# Patient Record
Sex: Male | Born: 1960 | Race: White | Hispanic: No | Marital: Single
Health system: Southern US, Community
[De-identification: ages and names within clinical notes are randomized; demographics above are authoritative.]

## PROBLEM LIST (undated history)

## (undated) HISTORY — PX: NEPHRECTOMY TRANSPLANTED ORGAN: SUR880

---

## 2018-11-24 ENCOUNTER — Emergency Department (HOSPITAL_COMMUNITY): Payer: Medicare HMO

## 2018-11-24 ENCOUNTER — Encounter (HOSPITAL_COMMUNITY): Payer: Self-pay | Admitting: Emergency Medicine

## 2018-11-24 ENCOUNTER — Inpatient Hospital Stay (HOSPITAL_COMMUNITY)
Admission: EM | Admit: 2018-11-24 | Discharge: 2018-12-06 | DRG: 871 | Disposition: E | Payer: Medicare HMO | Attending: Pulmonary Disease | Admitting: Pulmonary Disease

## 2018-11-24 DIAGNOSIS — Z9049 Acquired absence of other specified parts of digestive tract: Secondary | ICD-10-CM | POA: Diagnosis not present

## 2018-11-24 DIAGNOSIS — I314 Cardiac tamponade: Secondary | ICD-10-CM | POA: Insufficient documentation

## 2018-11-24 DIAGNOSIS — Z79891 Long term (current) use of opiate analgesic: Secondary | ICD-10-CM

## 2018-11-24 DIAGNOSIS — Z7982 Long term (current) use of aspirin: Secondary | ICD-10-CM

## 2018-11-24 DIAGNOSIS — M47816 Spondylosis without myelopathy or radiculopathy, lumbar region: Secondary | ICD-10-CM | POA: Diagnosis present

## 2018-11-24 DIAGNOSIS — E669 Obesity, unspecified: Secondary | ICD-10-CM | POA: Diagnosis present

## 2018-11-24 DIAGNOSIS — R1084 Generalized abdominal pain: Secondary | ICD-10-CM

## 2018-11-24 DIAGNOSIS — Z91018 Allergy to other foods: Secondary | ICD-10-CM | POA: Diagnosis not present

## 2018-11-24 DIAGNOSIS — R10817 Generalized abdominal tenderness: Secondary | ICD-10-CM | POA: Diagnosis present

## 2018-11-24 DIAGNOSIS — R6521 Severe sepsis with septic shock: Secondary | ICD-10-CM

## 2018-11-24 DIAGNOSIS — N179 Acute kidney failure, unspecified: Secondary | ICD-10-CM | POA: Diagnosis present

## 2018-11-24 DIAGNOSIS — A419 Sepsis, unspecified organism: Secondary | ICD-10-CM | POA: Diagnosis present

## 2018-11-24 DIAGNOSIS — I313 Pericardial effusion (noninflammatory): Secondary | ICD-10-CM | POA: Diagnosis present

## 2018-11-24 DIAGNOSIS — R570 Cardiogenic shock: Secondary | ICD-10-CM | POA: Diagnosis not present

## 2018-11-24 DIAGNOSIS — E872 Acidosis, unspecified: Secondary | ICD-10-CM | POA: Insufficient documentation

## 2018-11-24 DIAGNOSIS — I2510 Atherosclerotic heart disease of native coronary artery without angina pectoris: Secondary | ICD-10-CM | POA: Diagnosis present

## 2018-11-24 DIAGNOSIS — Z91013 Allergy to seafood: Secondary | ICD-10-CM | POA: Diagnosis not present

## 2018-11-24 DIAGNOSIS — R0689 Other abnormalities of breathing: Secondary | ICD-10-CM | POA: Diagnosis present

## 2018-11-24 DIAGNOSIS — Z791 Long term (current) use of non-steroidal anti-inflammatories (NSAID): Secondary | ICD-10-CM | POA: Diagnosis not present

## 2018-11-24 DIAGNOSIS — M542 Cervicalgia: Secondary | ICD-10-CM | POA: Diagnosis present

## 2018-11-24 DIAGNOSIS — Z79899 Other long term (current) drug therapy: Secondary | ICD-10-CM | POA: Diagnosis not present

## 2018-11-24 DIAGNOSIS — R079 Chest pain, unspecified: Secondary | ICD-10-CM

## 2018-11-24 DIAGNOSIS — R112 Nausea with vomiting, unspecified: Secondary | ICD-10-CM | POA: Diagnosis present

## 2018-11-24 DIAGNOSIS — Z6829 Body mass index (BMI) 29.0-29.9, adult: Secondary | ICD-10-CM

## 2018-11-24 DIAGNOSIS — Z881 Allergy status to other antibiotic agents status: Secondary | ICD-10-CM | POA: Diagnosis not present

## 2018-11-24 DIAGNOSIS — Z940 Kidney transplant status: Secondary | ICD-10-CM | POA: Diagnosis not present

## 2018-11-24 DIAGNOSIS — I469 Cardiac arrest, cause unspecified: Secondary | ICD-10-CM | POA: Diagnosis not present

## 2018-11-24 DIAGNOSIS — J9811 Atelectasis: Secondary | ICD-10-CM | POA: Diagnosis present

## 2018-11-24 DIAGNOSIS — R579 Shock, unspecified: Secondary | ICD-10-CM

## 2018-11-24 LAB — COMPREHENSIVE METABOLIC PANEL
ALT: 33 U/L (ref 0–44)
AST: 51 U/L — ABNORMAL HIGH (ref 15–41)
Albumin: 3.7 g/dL (ref 3.5–5.0)
Alkaline Phosphatase: 66 U/L (ref 38–126)
Anion gap: 17 — ABNORMAL HIGH (ref 5–15)
BUN: 28 mg/dL — ABNORMAL HIGH (ref 6–20)
CALCIUM: 8.9 mg/dL (ref 8.9–10.3)
CO2: 18 mmol/L — AB (ref 22–32)
Chloride: 103 mmol/L (ref 98–111)
Creatinine, Ser: 2.13 mg/dL — ABNORMAL HIGH (ref 0.61–1.24)
GFR calc Af Amer: 39 mL/min — ABNORMAL LOW (ref >60)
GFR calc non Af Amer: 34 mL/min — ABNORMAL LOW (ref >60)
Glucose, Bld: 219 mg/dL — ABNORMAL HIGH (ref 70–99)
Potassium: 4.3 mmol/L (ref 3.5–5.1)
Sodium: 138 mmol/L (ref 135–145)
Total Bilirubin: 0.9 mg/dL (ref 0.3–1.2)
Total Protein: 5.9 g/dL — ABNORMAL LOW (ref 6.5–8.1)

## 2018-11-24 LAB — I-STAT ARTERIAL BLOOD GAS, ED
Acid-base deficit: 11 mmol/L — ABNORMAL HIGH (ref 0.0–2.0)
Bicarbonate: 14.5 mmol/L — ABNORMAL LOW (ref 20.0–28.0)
O2 SAT: 92 %
PCO2 ART: 29.2 mmHg — AB (ref 32.0–48.0)
PH ART: 7.298 — AB (ref 7.350–7.450)
Patient temperature: 96.7
TCO2: 15 mmol/L — ABNORMAL LOW (ref 22–32)
pO2, Arterial: 65 mmHg — ABNORMAL LOW (ref 83.0–108.0)

## 2018-11-24 LAB — I-STAT CG4 LACTIC ACID, ED: Lactic Acid, Venous: 6.25 mmol/L (ref 0.5–1.9)

## 2018-11-24 LAB — CBC
HCT: 43.5 % (ref 39.0–52.0)
Hemoglobin: 14 g/dL (ref 13.0–17.0)
MCH: 30.9 pg (ref 26.0–34.0)
MCHC: 32.2 g/dL (ref 30.0–36.0)
MCV: 96 fL (ref 80.0–100.0)
Platelets: 206 10*3/uL (ref 150–400)
RBC: 4.53 MIL/uL (ref 4.22–5.81)
RDW: 12.4 % (ref 11.5–15.5)
WBC: 13.2 10*3/uL — ABNORMAL HIGH (ref 4.0–10.5)
nRBC: 0 % (ref 0.0–0.2)

## 2018-11-24 LAB — TYPE AND SCREEN
ABO/RH(D): B NEG
Antibody Screen: NEGATIVE

## 2018-11-24 LAB — I-STAT CHEM 8, ED
BUN: 31 mg/dL — ABNORMAL HIGH (ref 6–20)
CALCIUM ION: 1.11 mmol/L — AB (ref 1.15–1.40)
CHLORIDE: 104 mmol/L (ref 98–111)
Creatinine, Ser: 1.9 mg/dL — ABNORMAL HIGH (ref 0.61–1.24)
Glucose, Bld: 231 mg/dL — ABNORMAL HIGH (ref 70–99)
HCT: 44 % (ref 39.0–52.0)
Hemoglobin: 15 g/dL (ref 13.0–17.0)
Potassium: 3.9 mmol/L (ref 3.5–5.1)
Sodium: 138 mmol/L (ref 135–145)
TCO2: 22 mmol/L (ref 22–32)

## 2018-11-24 LAB — I-STAT TROPONIN, ED: Troponin i, poc: 0.01 ng/mL (ref 0.00–0.08)

## 2018-11-24 LAB — I-STAT CREATININE, ED: Creatinine, Ser: 1.9 mg/dL — ABNORMAL HIGH (ref 0.61–1.24)

## 2018-11-24 LAB — PROTIME-INR
INR: 1.09
Prothrombin Time: 14.1 seconds (ref 11.4–15.2)

## 2018-11-24 LAB — BRAIN NATRIURETIC PEPTIDE: B Natriuretic Peptide: 172 pg/mL — ABNORMAL HIGH (ref 0.0–100.0)

## 2018-11-24 LAB — ABO/RH: ABO/RH(D): B NEG

## 2018-11-24 LAB — LIPASE, BLOOD: LIPASE: 21 U/L (ref 11–51)

## 2018-11-24 MED ORDER — HYDROCORTISONE NA SUCCINATE PF 100 MG IJ SOLR: 50.0000 mg | Freq: Four times a day (QID) | INTRAMUSCULAR | Status: DC

## 2018-11-24 MED ORDER — VANCOMYCIN HCL IN DEXTROSE 1-5 GM/200ML-% IV SOLN: 1000.0000 mg | Freq: Once | INTRAVENOUS | Status: DC

## 2018-11-24 MED ORDER — KETAMINE HCL 50 MG/5ML IJ SOSY
0.3000 mg/kg | PREFILLED_SYRINGE | Freq: Once | INTRAMUSCULAR | Status: AC
  Administered 2018-11-24: 25 mg via INTRAVENOUS
  Filled 2018-11-24: qty 5

## 2018-11-24 MED ORDER — SODIUM CHLORIDE 0.9 % IV BOLUS
1000.0000 mL | Freq: Once | INTRAVENOUS | Status: AC
  Administered 2018-11-24: 1000 mL via INTRAVENOUS

## 2018-11-24 MED ORDER — SODIUM CHLORIDE 0.9 % IV SOLN: 1.0000 g | INTRAVENOUS | Status: DC

## 2018-11-24 MED ORDER — LACTATED RINGERS IV SOLN: INTRAVENOUS | Status: DC

## 2018-11-24 MED ORDER — NOREPINEPHRINE-SODIUM CHLORIDE 4-0.9 MG/250ML-% IV SOLN
2.0000 ug/min | INTRAVENOUS | Status: DC
  Filled 2018-11-24: qty 250

## 2018-11-24 MED ORDER — HYDROCORTISONE NA SUCCINATE PF 100 MG IJ SOLR
100.0000 mg | Freq: Once | INTRAMUSCULAR | Status: AC
  Administered 2018-11-24: 100 mg via INTRAVENOUS
  Filled 2018-11-24: qty 2

## 2018-11-24 MED ORDER — SODIUM CHLORIDE 0.9 % IV SOLN: 250.0000 mL | INTRAVENOUS | Status: DC

## 2018-11-24 MED ORDER — VANCOMYCIN HCL 10 G IV SOLR
1500.0000 mg | Freq: Once | INTRAVENOUS | Status: AC
  Administered 2018-11-24: 1500 mg via INTRAVENOUS
  Filled 2018-11-24: qty 1500

## 2018-11-24 MED ORDER — PIPERACILLIN-TAZOBACTAM 3.375 G IVPB: 3.3750 g | Freq: Three times a day (TID) | INTRAVENOUS | Status: DC

## 2018-11-24 MED ORDER — METRONIDAZOLE IN NACL 5-0.79 MG/ML-% IV SOLN
500.0000 mg | Freq: Three times a day (TID) | INTRAVENOUS | Status: DC
  Administered 2018-11-24: 500 mg via INTRAVENOUS
  Filled 2018-11-24: qty 100

## 2018-11-24 MED ORDER — SODIUM CHLORIDE 0.9% FLUSH: 3.0000 mL | Freq: Once | INTRAVENOUS | Status: DC

## 2018-11-24 MED ORDER — SODIUM CHLORIDE 0.9 % IV SOLN
2.0000 g | Freq: Once | INTRAVENOUS | Status: AC
  Administered 2018-11-24: 2 g via INTRAVENOUS
  Filled 2018-11-24: qty 2

## 2018-11-24 MED ORDER — SODIUM CHLORIDE 0.9 % IV SOLN
80.0000 mg | Freq: Once | INTRAVENOUS | Status: DC
  Filled 2018-11-24: qty 80

## 2018-11-24 MED ORDER — ONDANSETRON HCL 4 MG/2ML IJ SOLN
4.0000 mg | Freq: Once | INTRAMUSCULAR | Status: AC
  Administered 2018-11-24: 4 mg via INTRAVENOUS
  Filled 2018-11-24: qty 2

## 2018-11-24 MED ORDER — VANCOMYCIN HCL 10 G IV SOLR: 1750.0000 mg | INTRAVENOUS | Status: DC

## 2018-11-24 NOTE — H&P (Signed)
PULMONARY / CRITICAL CARE MEDICINE   NAME:  Ricky Schaefer, MRN:  [id number], DOB:  25-Sep-1961, LOS: 0 ADMISSION DATE:  12/04/2018, CONSULTATION DATE: 11/20/2018 REFERRING MD: ED physician, CHIEF COMPLAINT: Chest pain  BRIEF HISTORY:    58 year old male with kidney transplant coming in with acute chest pain hypotension and large pericardial effusion   HISTORY OF PRESENT ILLNESS   Ricky Schaefer is a 58 year old male with history of kidney transplant who was in his usual state of health until this afternoon when he had a sudden onset of chest pain and shortness of breath and some nausea and had couple of episodes of vomiting.  According to him he was feeling well no fever no chills no rigors he was actually chopping wood this morning until this afternoon when he started having this central sharp chest pain that is worsened with taking deep breath and patient does also have trouble breathing when he lays flat.  Patient came into the emergency room was found to be hypotensive received 4 L of IV fluids and remains hypotensive CT chest and abdomen showed large circumferential pericardial effusion cardiology are consulted and are awaiting an echocardiogram.  While I was examined the patient patient received ketamine and then became very lethargic could not take any more history from the patient and most of the history was taken from his partner.   SIGNIFICANT PAST MEDICAL HISTORY   -Kidney transplant 2009   SIGNIFICANT EVENTS:   STUDIES:   CT scan of the chest and abdomen:  1. Circumferential 1.8 cm pericardial effusion. 2. Left main and 2 vessel coronary arteriosclerosis. 3. Calcified pleural plaque and pleural thickening at the lung bases with adjacent atelectasis. 4. Right upper quadrant mesenteric edema and fluid in the region of the duodenal bulb and second portion of duodenum raises suspicion for peptic ulcer disease. Differential possibility may include acute pancreatitis the though the pancreas  is markedly atrophic and replaced by fat. 5. Atrophic native kidneys with transplanted kidneys in the right lower quadrant. No complicating features. 6. Lumbar spondylosis.  CULTURES:  Blood cultures>>    ANTIBIOTICS:  Received cefepime Flagyl vancomycin  Zosyn 11/06/2018>> Vancomycin 11/12/2018>>  LINES/TUBES:    CONSULTANTS:  Cardiology  SUBJECTIVE:    CONSTITUTIONAL: BP 110/80   Pulse 72   Resp (!) 23   Ht 5\' 6"  (1.676 m)   Wt 83 kg   SpO2 97%   BMI 29.54 kg/m   No intake/output data recorded.   Patient was hypotensive blood pressure 86/58     PHYSICAL EXAM: General: Patient looks toxic acutely ill in moderate distress with cushingoid face patient became lethargic after receiving ketamine.   Neuro: Awake alert following commands moving all extremities but then became lethargic after receiving ketamine HEENT: Dry mucous membranes Cardiovascular: Normal heart sound no added sounds or murmurs Lungs: Clear equal air sounds bilaterally no crackles no wheezing Abdomen: Soft no tenderness obese Musculoskeletal: Cold extremities no edema Skin: No rash  RESOLVED PROBLEM LIST   ASSESSMENT AND PLAN   Assessment: -Cardiogenic shock -Large pericardial effusion suspecting pericardial tamponade -Septic shock -Lactic acidosis -Acute kidney injury -History of kidney transplant -Immunocompromised   Plan: -Admit to the intensive care unit -Patient received 4 L IV fluid boluses -Start Levophed drip and titrate to keep map above 65 -Stat 50 mg IV hydrocortisone every 6 hours -Discussed with cardiology in regard of concern about cardiac tamponade -Stat echocardiogram -Start Zosyn and vancomycin -Follow urine output and renal function -Patient received ketamine while I was  evaluating him and then became more lethargic.  Discussed with the emergency room physician about change in status and recommended a Stat ABG and close observation in case patient needs to be  intubated -Repeat lactic acid -Hold CellCept for now -Consult nephrology   I have spent 55 minutes of critical care time this time was spent bedside or in the unit this time was exclusive any billable procedures patient is needing intensive care due to septic shock requiring vasopressors  SUMMARY OF TODAY'S PLAN:    Best Practice / Goals of Care / Disposition.   DVT PROPHYLAXIS: SCD SUP: None NUTRITION: N.p.o. MOBILITY: Bedrest GOALS OF CARE: ICU admission full code FAMILY DISCUSSIONS: Discussed with partner at bedside DISPOSITION ICU admission  LABS  Glucose No results for input(s): GLUCAP in the last 168 hours.  BMET Recent Labs  Lab 11/28/2018 2024 11/16/2018 2027 12/05/2018 2034  NA  --  138 138  K  --  3.9 4.3  CL  --  104 103  CO2  --   --  18*  BUN  --  31* 28*  CREATININE 1.90* 1.90* 2.13*  GLUCOSE  --  231* 219*    Liver Enzymes Recent Labs  Lab 12/05/2018 2034  AST 51*  ALT 33  ALKPHOS 66  BILITOT 0.9  ALBUMIN 3.7    Electrolytes Recent Labs  Lab 11/06/2018 2034  CALCIUM 8.9    CBC Recent Labs  Lab 11/13/2018 2027 11/18/2018 2034  WBC  --  13.2*  HGB 15.0 14.0  HCT 44.0 43.5  PLT  --  206    ABG No results for input(s): PHART, PCO2ART, PO2ART in the last 168 hours.  Coag's Recent Labs  Lab 11/12/2018 2034  INR 1.09    Sepsis Markers Recent Labs  Lab 11/13/2018 2024  LATICACIDVEN 6.25*    Cardiac Enzymes No results for input(s): TROPONINI, PROBNP in the last 168 hours.  PAST MEDICAL HISTORY :   He  has no past medical history on file.  PAST SURGICAL HISTORY:  He  has a past surgical history that includes Nephrectomy transplanted organ.  Allergies  Allergen Reactions  . Cashew Nut Oil Swelling    Throat swells  . Shellfish Allergy Rash  . Tetracyclines & Related Rash and Other (See Comments)    spasms    No current facility-administered medications on file prior to encounter.    Current Outpatient Medications on File Prior  to Encounter  Medication Sig  . albuterol (PROVENTIL HFA;VENTOLIN HFA) 108 (90 Base) MCG/ACT inhaler Inhale 2 puffs into the lungs every 6 (six) hours as needed for wheezing or shortness of breath.  . allopurinol (ZYLOPRIM) 300 MG tablet Take 300 mg by mouth daily.  Marland Kitchen ALPRAZolam (XANAX) 1 MG tablet Take 1 mg by mouth 3 (three) times daily as needed for anxiety.  Marland Kitchen aspirin EC 81 MG tablet Take 81 mg by mouth daily.  . betamethasone valerate lotion (VALISONE) 0.1 % Apply 1 application topically 2 (two) times daily as needed for irritation (scalp itching).  . cholecalciferol (VITAMIN D3) 25 MCG (1000 UT) tablet Take 1,000 Units by mouth daily.  . cimetidine (TAGAMET) 400 MG tablet Take 400 mg by mouth daily.  . colchicine 0.6 MG tablet Take 0.6 mg by mouth 3 (three) times daily as needed (gout attack (taper down as soon as able)).  . diclofenac sodium (VOLTAREN) 1 % GEL Apply 1 application topically 2 (two) times daily as needed (shoulder pain).  Marland Kitchen dicyclomine (BENTYL) 20 MG  tablet Take 20 mg by mouth 3 (three) times daily before meals.  . furosemide (LASIX) 80 MG tablet Take 40-80 mg by mouth See admin instructions. Take one tablet (80 mg) by mouth every morning and 1/2 tablet (40 mg) at night  . HYDROcodone-acetaminophen (NORCO/VICODIN) 5-325 MG tablet Take 1 tablet by mouth every 6 (six) hours as needed for moderate pain.  Marland Kitchen ibuprofen (ADVIL,MOTRIN) 200 MG tablet Take 400 mg by mouth every 6 (six) hours as needed for headache (pain).  . Magnesium 500 MG TABS Take 500 mg by mouth 2 (two) times daily.  . metoprolol succinate (TOPROL-XL) 100 MG 24 hr tablet Take 100 mg by mouth at bedtime. Take with or immediately following a meal.  . mycophenolate (CELLCEPT) 250 MG capsule Take 1,000 mg by mouth 2 (two) times daily.  Marland Kitchen omeprazole (PRILOSEC OTC) 20 MG tablet Take 20 mg by mouth at bedtime.  . predniSONE (DELTASONE) 5 MG tablet Take 5 mg by mouth daily with breakfast.  . sodium bicarbonate 650 MG  tablet Take 1,300 mg by mouth 2 (two) times daily.  Marland Kitchen sulfamethoxazole-trimethoprim (BACTRIM,SEPTRA) 400-80 MG tablet Take 1 tablet by mouth every Monday, Wednesday, and Friday.  . tamsulosin (FLOMAX) 0.4 MG CAPS capsule Take 0.4 mg by mouth at bedtime.    FAMILY HISTORY:   No significant family history  SOCIAL HISTORY:  Patient has a partner at bedside denies any smoking no alcohol abuse  REVIEW OF SYSTEMS:    All 11 point system review were unremarkable other than what is mentioned history of present illness

## 2018-11-24 NOTE — ED Notes (Signed)
RN and pt back from CT.  Pt refused contrast

## 2018-11-24 NOTE — ED Notes (Signed)
Provider's bedside due to unresponsive reaction to Ketamine.

## 2018-11-24 NOTE — ED Triage Notes (Signed)
Per EMS,. Pt has centeral chest pn that is radiating to his neck.  Pt is cool, pale diaphoretic.  KIdney transplant in 2010.  70/40, 80bpm, 600normal saline and 324 ASA in route.

## 2018-11-24 NOTE — ED Notes (Signed)
Pt c/o abdominal pain, appears to have thrown up.

## 2018-11-24 NOTE — Progress Notes (Signed)
Pharmacy Antibiotic Note  Ricky Schaefer is a 58 y.o. male admitted on 12-13-18 with sepsis.  Pharmacy has been consulted for Vancomycin and Cefepime dosing. Also on Flagyl.  Plan: Cefepime 1 gm IV q24h Vancomycin 1500mg  IV now then 1750 mg IV Q 36 hrs. Goal AUC 400-550. Expected AUC: 520 SCr used: 1.9 Will f/u micro data, pt's clinical condition, and renal function Vanc levels prn   Height: 5\' 6"  (167.6 cm) Weight: 183 lb (83 kg) IBW/kg (Calculated) : 63.8  No data recorded.  Recent Labs  Lab 12-13-18 2024 12-13-18 2027  CREATININE 1.90* 1.90*  LATICACIDVEN 6.25*  --     Estimated Creatinine Clearance: 44.4 mL/min (A) (by C-G formula based on SCr of 1.9 mg/dL (H)).    Not on File  Antimicrobials this admission: 1/20 Vanc >>  1/20 Cefepime >>  1/20 Flagyl>>  Microbiology results: Pending  Thank you for allowing pharmacy to be a part of this patient's care.  Christoper Fabian, PharmD, BCPS Clinical pharmacist  **Pharmacist phone directory can now be found on [email or web address] ([email or web address]).  Listed under [facility]. 12-13-2018 8:33 PM

## 2018-11-24 NOTE — ED Notes (Signed)
Dr. Messick called about Lactic results 

## 2018-11-24 NOTE — ED Notes (Signed)
Pt communicating through moans and groans.  Able to answer yes and now questions.

## 2018-11-24 NOTE — ED Provider Notes (Signed)
[facility] EMERGENCY DEPARTMENT Provider Note   CSN: [id number] Arrival date & time: 12/03/2018  2004     History   Chief Complaint Chief Complaint  Patient presents with  . Chest Pain    HPI Ricky Schaefer is a 58 y.o. male.  Patient with history of kidney transplant, on immunosuppressive medication, cholecystectomy --presents the emergency department with acute onset of chest pain with radiation to the neck and abdomen starting approximately an hour prior to arrival.  He had an episode of vomiting.  No vision changes.  He feels short of breath.  He complains of feeling the need to have a bowel movement.  He denies any abdominal swelling.  No numbness or tingling in the legs.  Denies history of ACS or heart surgery.  Denies blood thinners.      History reviewed. No pertinent past medical history.  Patient Active Problem List   Diagnosis Date Noted  . Cardiogenic shock (HCC) 11/14/2018    Past Surgical History:  Procedure Laterality Date  . NEPHRECTOMY TRANSPLANTED ORGAN          Home Medications    Prior to Admission medications   Medication Sig Start Date End Date Taking? Authorizing Provider  albuterol (PROVENTIL HFA;VENTOLIN HFA) 108 (90 Base) MCG/ACT inhaler Inhale 2 puffs into the lungs every 6 (six) hours as needed for wheezing or shortness of breath.   Yes [provider]  allopurinol (ZYLOPRIM) 300 MG tablet Take 300 mg by mouth daily.   Yes [provider]  ALPRAZolam Prudy Feeler) 1 MG tablet Take 1 mg by mouth 3 (three) times daily as needed for anxiety.   Yes [provider]  aspirin EC 81 MG tablet Take 81 mg by mouth daily.   Yes [provider]  betamethasone valerate lotion (VALISONE) 0.1 % Apply 1 application topically 2 (two) times daily as needed for irritation (scalp itching).   Yes [provider]  cholecalciferol (VITAMIN D3) 25 MCG (1000 UT) tablet Take 1,000 Units by mouth daily.   Yes  [provider]  cimetidine (TAGAMET) 400 MG tablet Take 400 mg by mouth daily.   Yes [provider]  colchicine 0.6 MG tablet Take 0.6 mg by mouth 3 (three) times daily as needed (gout attack (taper down as soon as able)).   Yes [provider]  diclofenac sodium (VOLTAREN) 1 % GEL Apply 1 application topically 2 (two) times daily as needed (shoulder pain).   Yes [provider]  dicyclomine (BENTYL) 20 MG tablet Take 20 mg by mouth 3 (three) times daily before meals.   Yes [provider]  furosemide (LASIX) 80 MG tablet Take 40-80 mg by mouth See admin instructions. Take one tablet (80 mg) by mouth every morning and 1/2 tablet (40 mg) at night   Yes [provider]  HYDROcodone-acetaminophen (NORCO/VICODIN) 5-325 MG tablet Take 1 tablet by mouth every 6 (six) hours as needed for moderate pain.   Yes [provider]  ibuprofen (ADVIL,MOTRIN) 200 MG tablet Take 400 mg by mouth every 6 (six) hours as needed for headache (pain).   Yes [provider]  Magnesium 500 MG TABS Take 500 mg by mouth 2 (two) times daily.   Yes [provider]  metoprolol succinate (TOPROL-XL) 100 MG 24 hr tablet Take 100 mg by mouth at bedtime. Take with or immediately following a meal.   Yes [provider]  mycophenolate (CELLCEPT) 250 MG capsule Take 1,000 mg by mouth 2 (  two) times daily.   Yes [provider]  omeprazole (PRILOSEC OTC) 20 MG tablet Take 20 mg by mouth at bedtime.   Yes [provider]  predniSONE (DELTASONE) 5 MG tablet Take 5 mg by mouth daily with breakfast.   Yes [provider]  sodium bicarbonate 650 MG tablet Take 1,300 mg by mouth 2 (two) times daily.   Yes [provider]  sulfamethoxazole-trimethoprim (BACTRIM,SEPTRA) 400-80 MG tablet Take 1 tablet by mouth every Monday, Wednesday, and Friday.   Yes [provider]  tamsulosin (FLOMAX) 0.4 MG CAPS capsule Take  0.4 mg by mouth at bedtime.   Yes [provider]    Family History No family history on file.  Social History Social History   Tobacco Use  . Smoking status: Not on file  Substance Use Topics  . Alcohol use: Not on file  . Drug use: Not on file     Allergies   Cashew nut oil; Shellfish allergy; and Tetracyclines & related   Review of Systems Review of Systems  Constitutional: Negative for diaphoresis and fever.  HENT: Negative for rhinorrhea and sore throat.   Eyes: Negative for redness.  Respiratory: Positive for shortness of breath. Negative for cough.   Cardiovascular: Positive for chest pain. Negative for palpitations and leg swelling.  Gastrointestinal: Positive for abdominal pain, nausea and vomiting. Negative for diarrhea.  Genitourinary: Negative for dysuria.  Musculoskeletal: Positive for neck pain. Negative for back pain and myalgias.  Skin: Negative for rash.  Neurological: Negative for syncope, light-headedness and headaches.  Psychiatric/Behavioral: The patient is not nervous/anxious.      Physical Exam Updated Vital Signs BP (!) 77/59   Pulse 69   Resp (!) 28   Ht 5\' 6"  (1.676 m)   Wt 83 kg   SpO2 98%   BMI 29.54 kg/m   Physical Exam Vitals signs and nursing note reviewed.  Constitutional:      General: He is in acute distress (Pt appears very uncomfortable).     Appearance: He is well-developed. He is not diaphoretic.  HENT:     Head: Normocephalic and atraumatic.     Mouth/Throat:     Mouth: Mucous membranes are not dry.  Eyes:     Conjunctiva/sclera: Conjunctivae normal.  Neck:     Musculoskeletal: Normal range of motion and neck supple. No muscular tenderness.     Vascular: Normal carotid pulses. No carotid bruit or JVD.     Trachea: Trachea normal. No tracheal deviation.  Cardiovascular:     Rate and Rhythm: Normal rate and regular rhythm.     Pulses: No decreased pulses.          Radial pulses are 1+ on the right side and  1+ on the left side.     Heart sounds: S1 normal and S2 normal. Heart sounds are distant. No murmur. No systolic murmur.  Pulmonary:     Effort: Pulmonary effort is normal. No respiratory distress.     Breath sounds: Normal breath sounds. No decreased breath sounds or wheezing.  Chest:     Chest wall: No tenderness.  Abdominal:     General: Bowel sounds are normal.     Palpations: Abdomen is soft.     Tenderness: There is abdominal tenderness. There is guarding. There is no rebound.     Comments: Tenderness is generalized. Moderate.   Skin:    General: Skin is warm.     Coloration: Skin is pale.  Neurological:  Mental Status: He is alert.      ED Treatments / Results  Labs (all labs ordered are listed, but only abnormal results are displayed) Labs Reviewed  CBC - Abnormal; Notable for the following components:      Result Value   WBC 13.2 (*)    All other components within normal limits  COMPREHENSIVE METABOLIC PANEL - Abnormal; Notable for the following components:   CO2 18 (*)    Glucose, Bld 219 (*)    BUN 28 (*)    Creatinine, Ser 2.13 (*)    Total Protein 5.9 (*)    AST 51 (*)    GFR calc non Af Amer 34 (*)    GFR calc Af Amer 39 (*)    Anion gap 17 (*)    All other components within normal limits  I-STAT CHEM 8, ED - Abnormal; Notable for the following components:   BUN 31 (*)    Creatinine, Ser 1.90 (*)    Glucose, Bld 231 (*)    Calcium, Ion 1.11 (*)    All other components within normal limits  I-STAT CG4 LACTIC ACID, ED - Abnormal; Notable for the following components:   Lactic Acid, Venous 6.25 (*)    All other components within normal limits  I-STAT CREATININE, ED - Abnormal; Notable for the following components:   Creatinine, Ser 1.90 (*)    All other components within normal limits  CULTURE, BLOOD (ROUTINE X 2)  CULTURE, BLOOD (ROUTINE X 2)  PROTIME-INR  LIPASE, BLOOD  BRAIN NATRIURETIC PEPTIDE  HIV ANTIBODY (ROUTINE TESTING W REFLEX)  CBC    BASIC METABOLIC PANEL  MAGNESIUM  I-STAT TROPONIN, ED  I-STAT ARTERIAL BLOOD GAS, ED  TYPE AND SCREEN  ABO/RH    EKG EKG Interpretation  Date/Time:  Monday November 24 2018 20:05:39 EST Ventricular Rate:  81 PR Interval:    QRS Duration: 81 QT Interval:  396 QTC Calculation: 460 R Axis:   60 Text Interpretation:  Sinus rhythm Baseline wander in lead(s) V4 Confirmed by Kristine RoyalMessick, Peter [id number]) on 12/03/2018 8:10:22 PM   Radiology Dg Chest Portable 1 View  Result Date: 11/21/2018 CLINICAL DATA:  Central chest pain for 1 day EXAM: PORTABLE CHEST 1 VIEW COMPARISON:  None. FINDINGS: Cardiac shadow is enlarged. Mild central vascular congestion is noted without significant edema. No focal infiltrate is seen. No bony abnormality is noted. IMPRESSION: Mild vascular congestion without significant edema. Electronically Signed   By: Alcide CleverMark  Lukens M.D.   On: 11/10/2018 20:36    Procedures .Critical Care Performed by: Renne CriglerGeiple, Fredda Clarida, PA-C Authorized by: Renne CriglerGeiple, Satori Krabill, PA-C   Critical care provider statement:    Critical care time (minutes):  70   Critical care time was exclusive of:  Separately billable procedures and treating other patients   Critical care was necessary to treat or prevent imminent or life-threatening deterioration of the following conditions:  Cardiac failure, circulatory failure, renal failure and shock   Critical care was time spent personally by me on the following activities:  Development of treatment plan with patient or surrogate, discussions with consultants, evaluation of patient's response to treatment, examination of patient, obtaining history from patient or surrogate, ordering and performing treatments and interventions, ordering and review of laboratory studies, ordering and review of radiographic studies, pulse oximetry and re-evaluation of patient's condition   I assumed direction of critical care for this patient from another provider in my specialty: no      (including critical care time)  Medications  Ordered in ED Medications  sodium chloride flush (NS) 0.9 % injection 3 mL (3 mLs Intravenous Not Given 12/14/18 2231)  metroNIDAZOLE (FLAGYL) IVPB 500 mg (500 mg Intravenous New Bag/Given Dec 14, 2018 2228)  vancomycin (VANCOCIN) 1,500 mg in sodium chloride 0.9 % 500 mL IVPB (1,500 mg Intravenous New Bag/Given 14-Dec-2018 2225)  ceFEPIme (MAXIPIME) 1 g in sodium chloride 0.9 % 100 mL IVPB (has no administration in time range)  vancomycin (VANCOCIN) 1,750 mg in sodium chloride 0.9 % 500 mL IVPB (has no administration in time range)  pantoprazole (PROTONIX) 80 mg in sodium chloride 0.9 % 100 mL IVPB (has no administration in time range)  ketamine 50 mg in normal saline 5 mL (10 mg/mL) syringe (has no administration in time range)  sodium chloride 0.9 % bolus 1,000 mL (0 mLs Intravenous Stopped December 14, 2018 2044)  ceFEPIme (MAXIPIME) 2 g in sodium chloride 0.9 % 100 mL IVPB (0 g Intravenous Stopped 2018-12-14 2143)  hydrocortisone sodium succinate (SOLU-CORTEF) 100 MG injection 100 mg (100 mg Intravenous Given 2018-12-14 2039)  sodium chloride 0.9 % bolus 1,000 mL (0 mLs Intravenous Stopped 2018-12-14 2123)  ondansetron (ZOFRAN) injection 4 mg (4 mg Intravenous Given December 14, 2018 2222)     Initial Impression / Assessment and Plan / ED Course  I have reviewed the triage vital signs and the nursing notes.  Pertinent labs & imaging results that were available during my care of the patient were reviewed by me and considered in my medical decision making (see chart for details).     Patient seen and examined.  History from EMS.  Dr. Rodena Medin has seen patient at bedside.  Fluids ordered for hypotension.  Awaiting preliminary lab work.  Will get portable chest x-ray.  ED EKG and EMS 12 leads reviewed without signs of STEMI.   Vital signs reviewed and are as follows: BP (!) 77/59   Pulse 69   Resp (!) 28   Ht 5\' 6"  (1.676 m)   Wt 83 kg   SpO2 98%   BMI 29.54 kg/m   8:49 PM  Patient receiving fluids. Spoke with family. Partner states BP typically runs low. States that his doctors are at [facility]. I am unable to pull up any records in care everywhere.   9:09 PM BP hasn't changed much with fluids. Not better or worse. Will CT ASAP. Discussed with Dr. Rodena Medin.   9:34 PM Pt reportedly refused IV contrast. CT spoke with Dr. Rodena Medin. Will obtain non-contrast CT.   10:13 PM Spoke with radiologist. Cardiology aware.   10:21 PM Spoke with ICU. They will see patient when able.   10:29 PM Requested surgery consult.   10:38 PM Pt rechecked. BP is marginally better. Pt still in a lot of pain. + vomiting. Discussed use of analgesic dose ketamine with Dr. Rodena Medin. 0.3mg /kg ordered.   10:54 PM Cards arranging for echo tonight. Critical care at bedside.    Final Clinical Impressions(s) / ED Diagnoses   Final diagnoses:  Chest pain, unspecified type  Generalized abdominal pain  Pericardial tamponade  Lactic acidosis   Admit to ICU.  ED Discharge Orders    None       Renne Crigler, Cordelia Poche 14-Dec-2018 2320    Wynetta Fines, MD 11/06/2018 2241

## 2018-11-24 NOTE — ED Notes (Signed)
Provider gave verbal order to drawl blood in foot.

## 2018-11-25 DIAGNOSIS — I469 Cardiac arrest, cause unspecified: Secondary | ICD-10-CM

## 2018-11-25 LAB — POCT I-STAT, CHEM 8
BUN: 27 mg/dL — ABNORMAL HIGH (ref 6–20)
CHLORIDE: 106 mmol/L (ref 98–111)
Calcium, Ion: 0.77 mmol/L — CL (ref 1.15–1.40)
Creatinine, Ser: 1.8 mg/dL — ABNORMAL HIGH (ref 0.61–1.24)
Glucose, Bld: 244 mg/dL — ABNORMAL HIGH (ref 70–99)
HCT: 34 % — ABNORMAL LOW (ref 39.0–52.0)
Hemoglobin: 11.6 g/dL — ABNORMAL LOW (ref 13.0–17.0)
Potassium: 6 mmol/L — ABNORMAL HIGH (ref 3.5–5.1)
Sodium: 155 mmol/L — ABNORMAL HIGH (ref 135–145)
TCO2: 36 mmol/L — ABNORMAL HIGH (ref 22–32)

## 2018-11-25 LAB — CBC
HEMATOCRIT: 38.1 % — AB (ref 39.0–52.0)
HEMOGLOBIN: 13.1 g/dL (ref 13.0–17.0)
MCH: 32 pg (ref 26.0–34.0)
MCHC: 34.4 g/dL (ref 30.0–36.0)
MCV: 92.9 fL (ref 80.0–100.0)
Platelets: 160 10*3/uL (ref 150–400)
RBC: 4.1 MIL/uL — ABNORMAL LOW (ref 4.22–5.81)
RDW: 12.6 % (ref 11.5–15.5)
WBC: 17 10*3/uL — ABNORMAL HIGH (ref 4.0–10.5)
nRBC: 0.4 % — ABNORMAL HIGH (ref 0.0–0.2)

## 2018-11-25 LAB — APTT: aPTT: 41 seconds — ABNORMAL HIGH (ref 24–36)

## 2018-11-25 MED FILL — Medication: Qty: 1 | Status: AC

## 2018-11-30 LAB — CULTURE, BLOOD (ROUTINE X 2)
Culture: NO GROWTH
Culture: NO GROWTH
Special Requests: ADEQUATE

## 2018-12-01 ENCOUNTER — Telehealth: Payer: Self-pay | Admitting: Pulmonary Disease

## 2018-12-01 NOTE — Telephone Encounter (Incomplete)
12/01/18 Received D/C from [facility] and [employer]. Sent to Dr.Sood at 2100 to see if he will sign it since I am unable to contact Dr. Minette Headland. PWR   12/03/18 Received D/C back from Dr. Arneta Cliche to [facility] and [place] [phone] and mailed original to [employer], [employer]. PWR

## 2018-12-06 NOTE — Progress Notes (Signed)
Code Blue.  Patient was not repsonding well and Chaplain went to be with family. Mother of patient had been taken home and the partner of patient was in the 2 H waiting area.  He was very upset as he learned about the code [facility] and the physician came and spoke with him and he came back to the room and saw the staff working very hard and patient did not recover.  He was devastated and said he could not call the mother of the patient.  Because chaplains are not to share medical info I asked the doctor to call the patient mother on behalf of his partner.  Dr. Bevely Palmer call to mother of patient.  She will come back in the morning with Gabriel Rung, the patients'  partner of many years.  I gathered contact info with  the patients mother-name, address and phone number and gave to nurse.  The partner and mother were given packet of info and handprints were made for the family and greatly appreciated.  The mother and partner will come back tomorrow to see the patient.  They have card to call placement services to request to view the body.    Joe was very upset and needed help finding his way back to where he parked. Chaplain showed him the way to where he parked. Phebe Colla, Chaplain   Dec 25, 2018 0100  Clinical Encounter Type  Visited With Patient not available;Other (Comment)  Visit Type Initial;Code;Critical Care;Death  Referral From Care management  Consult/Referral To Chaplain  Spiritual Encounters  Spiritual Needs Prayer;Emotional;Grief support  Stress Factors  Patient Stress Factors Health changes  Family Stress Factors Health changes;Loss

## 2018-12-06 NOTE — Progress Notes (Signed)
Dr. Minette Headland called code.  Patient CTB at 0010.  Dr. Minette Headland called patient mother and notified her of the events leading to the time of death.

## 2018-12-06 NOTE — Progress Notes (Signed)
Had been called for consult for mesteneric inflammation in RUQ On arrival, was told that pt had went apneic and arrested  And resuscitative efforts were unsuccessful.   Mary Sella. Andrey Campanile, MD, FACS General, Bariatric, & Minimally Invasive Surgery [facility], [place]

## 2018-12-06 NOTE — Progress Notes (Signed)
Patient received from ER.  Transferred from stretcher to bed.  Patient lying on side, complaining of chest pain and nausea.  Alert and oriented.  Patient started vomitting and became unresponsive.  Monitor indicated SR but no pulse was palpated, CPR was intiated and a Code Blue was called.  See code sheet.

## 2018-12-06 NOTE — Procedures (Signed)
Intubation Procedure Note Ricky Schaefer [id number] Feb 03, 1961  Procedure: Intubation Indications: Respiratory insufficiency Cardiac arrest  Procedure Details Consent: Unable to obtain consent because of emergent medical necessity. Time Out: Verified patient identification, verified procedure, site/side was marked, verified correct patient position, special equipment/implants available, medications/allergies/relevent history reviewed, required imaging and test results available.  Performed  Maximum sterile technique was used including gloves and mask.  MAC and 3 was used to intubate the patient during [place] from first attempt there was good air sounds bilaterally and good color change on the CO2 detector    Evaluation Hemodynamic Status: Was unstable during the whole procedure the patient was coding Patient's Current Condition: unstable Complications: No apparent complications    [name] 12/23/18

## 2018-12-06 NOTE — Consult Note (Signed)
  Brief Cardiology Consultation:   Patient ID: Ricky Schaefer MRN: [id number]; DOB: 06-Jun-1961  Admit date: 11/09/2018 Date of Consult: 12/05/18  Primary Care Provider: Chapman Moss, MD Primary Cardiologist: No primary care provider on file.  Primary Electrophysiologist:  None   Briefly, Ricky Schaefer is a 58 year old man with a history of renal transplant in 2010 who presented with the acute onset of chest pain, vomiting, and shortness of breath. Patient last at his active baseline (chopping wood) earlier on the morning of presentation. While in the drive through at [facility] he developed the acute onset of the aforementioned symptoms which resulted in him presenting to the ER. Upon arrival, patient hypotensive to the 80s/60 with HR 82. Initial EKG suggesting of atrial enlargement with ischemic change. Labs most notable for a Cr of 2.1, an HCO3 of 18, and a lactate of 6.25. Fluid resuscitation and broad spectrum abx were initiated. A CT chest/abdomen/pelvis without contrast was performed and notable for 1) a 1.8 cm circumferential pericardial effusion and 2) RUQ mesenteric edema and fluid. Cardiology consulted given pericardial effusion seen on CT.   On exam, patient laying nearly flat in bed on L side but with clear dyspnea/tachypnea and ongoing nausea and vomiting. He was complaining of intense abdominal and chest pain. Portable bedside echo confirmed the presence of a moderate pericardial effusion. Timeline of events was as follows:  1. Cardiology consulted 10:15 2. Patient seen and examined around 10:45 PM.  3. Operator called to arrange for STAT echo @ 11:00; repeat call and text to sonographer on call at 11:15 PM.  4. Case discussed with Dr. Gwenlyn Found (interventional attending) at 11:40 PM - agreed with STAT bedside echo to assess for tamponade.  5. 11:45 arranged to use PCCM sonogram to assess for tamponade. Met patient in ICU. Patient vomited while being positioned in bed and subsequently became  unresponsive, cyanotic. No palpable pulse. Code blue called and ACLS begun for PEA arrest. Dr. Gwenlyn Found again made aware of clinical deterioration. No role for bedside pericardiocentesis per attending.   Family member and chaplain at bedside following pronunciation.   [facility] will sign off.    For questions or updates, please contact [place] Please consult [email or web address] for contact info under   Signed, Milus Banister, MD  05-Dec-2018 12:19 AM

## 2018-12-06 NOTE — Significant Event (Signed)
Code ended at 00:10 per Dr. Minette Headland (CCM) at bedside. Pt still in PEA arrest. TOD called at 00:10 by Dr. Minette Headland. Cards fellow Dr. Charna Busman also at bedside during code and at TOD.

## 2018-12-06 NOTE — Discharge Summary (Signed)
Brief history:  Ricky Schaefer is a 58 year old male with history of kidney transplant who was in his usual state of health until this afternoon when he had a sudden onset of chest pain and shortness of breath and some nausea and had couple of episodes of vomiting.  According to him he was feeling well no fever no chills no rigors he was actually chopping wood this morning until this afternoon when he started having this central sharp chest pain that is worsened with taking deep breath and patient does also have trouble breathing when he lays flat.  Patient came into the emergency room was found to be hypotensive received 4 L of IV fluids and remains hypotensive CT chest and abdomen showed large circumferential pericardial effusion cardiology are consulted and are awaiting an echocardiogram.  While I was examined the patient patient received ketamine and then became very lethargic could not take any more history from the patient and most of the history was taken from his partner.  Patient was transferred to the intensive care unit for further management and upon arrival patient vomited and then lost his pulse and CPR was started as per ACLS protocol but unfortunately we were not able to regain return of circulation and patient was pronounced dead.  Mother and partner are both informed.  Please refer to the H&P and code sheet for further details.

## 2018-12-06 DEATH — deceased

## 2020-01-16 IMAGING — CT CT ABD-PELV W/O
2 of 5 series · 14 of 46 positions shown, 16 images · non-contrast
Comparison: None.

CLINICAL DATA: Central chest pain radiating to his neck.

EXAM:
CT CHEST, ABDOMEN AND PELVIS WITHOUT CONTRAST
TECHNIQUE: Multidetector CT imaging of the chest, abdomen and pelvis was
performed following the standard protocol without IV contrast.

[Series 5: cap w/o 3.0 mm st cor · coronal · non-contrast · 0.83mm/px · 3 of 117 slices shown]
[im 39/117  soft-tissue]
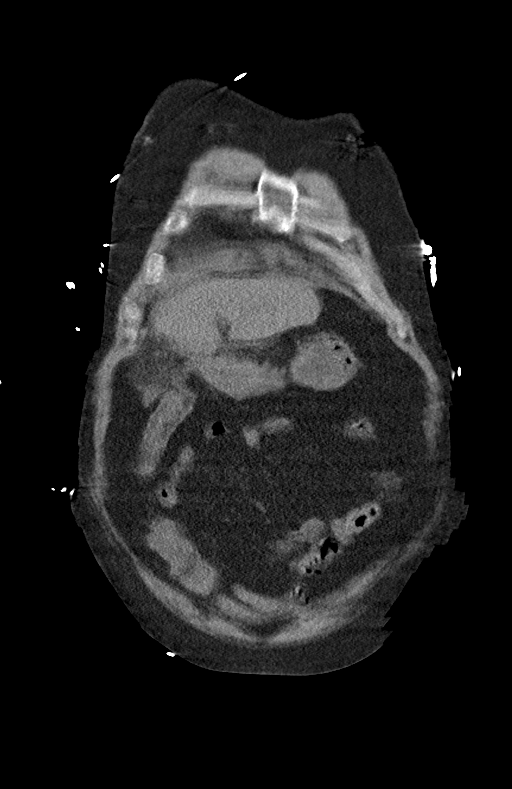
[im 52/117  soft-tissue]
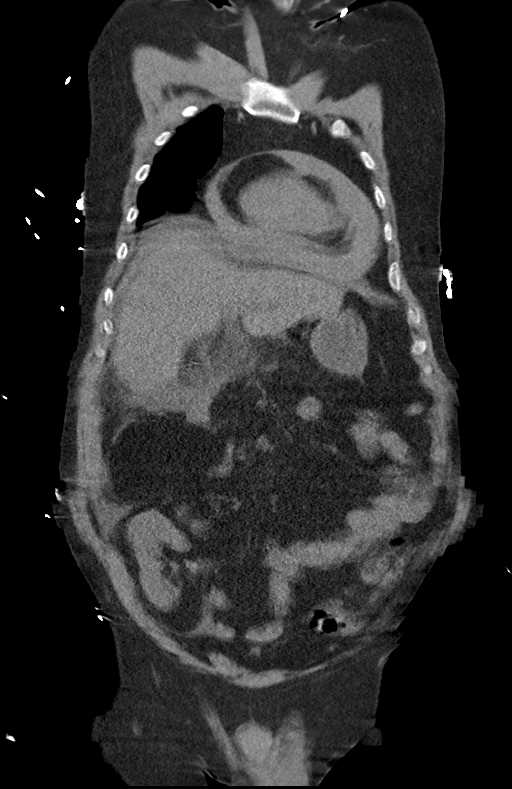
[im 65/117  soft-tissue]
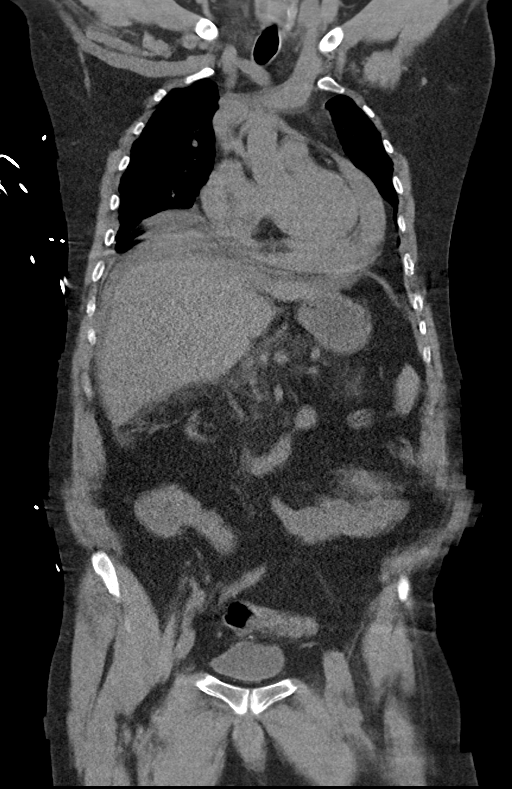

[Series 7: cap w/o 2.0 mm st · axial · non-contrast · 0.90mm/px · z∈[-254,+318]mm · 11 of 326 slices shown, 13 images]
[im 20/326  soft-tissue]
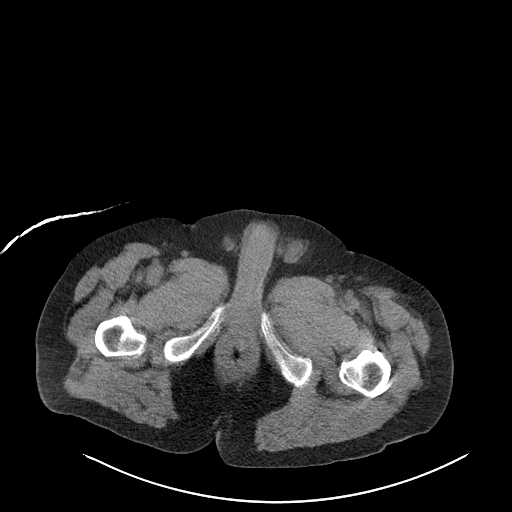
[im 20/326  bone]
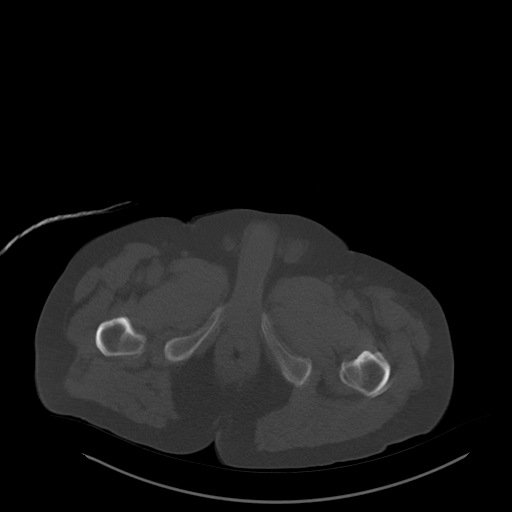
[im 58/326  soft-tissue]
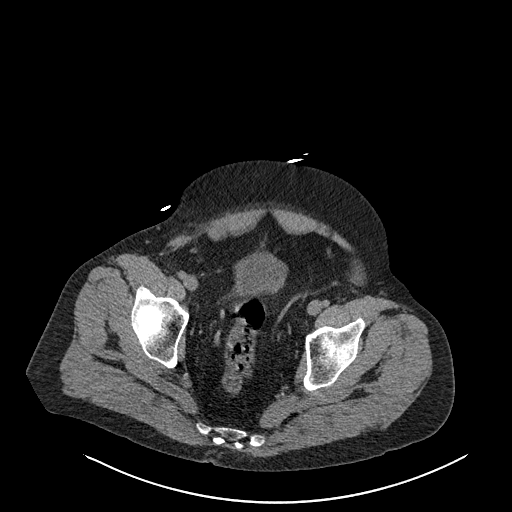
[im 77/326  soft-tissue]
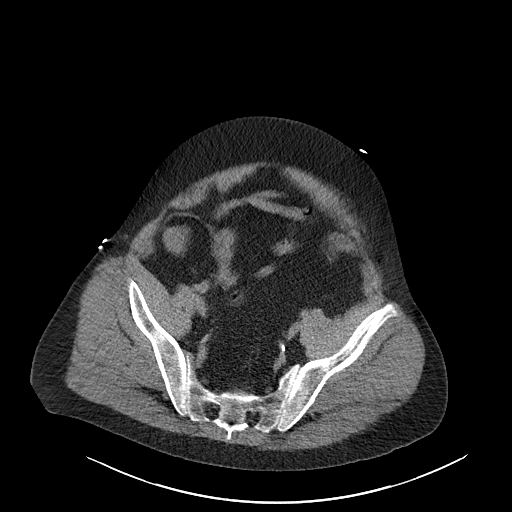
[im 115/326  soft-tissue]
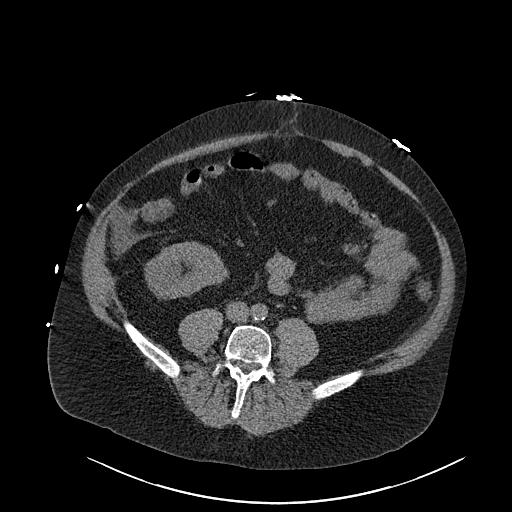
[im 134/326  soft-tissue]
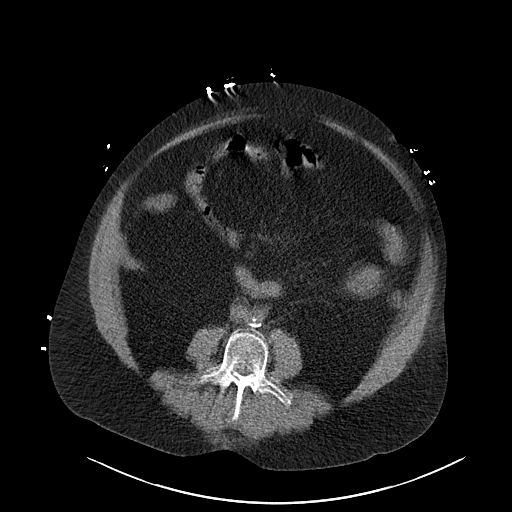
[im 173/326  soft-tissue]
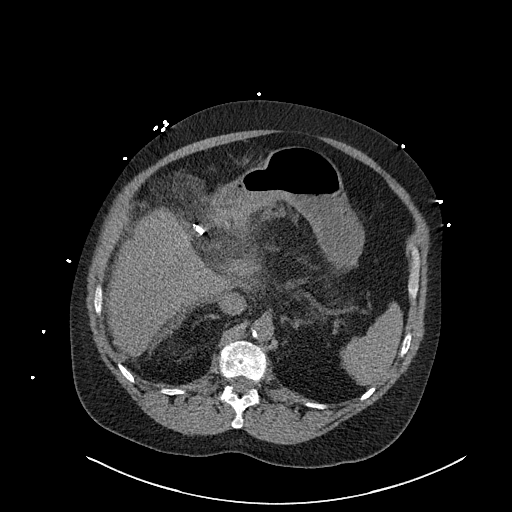
[im 192/326  soft-tissue]
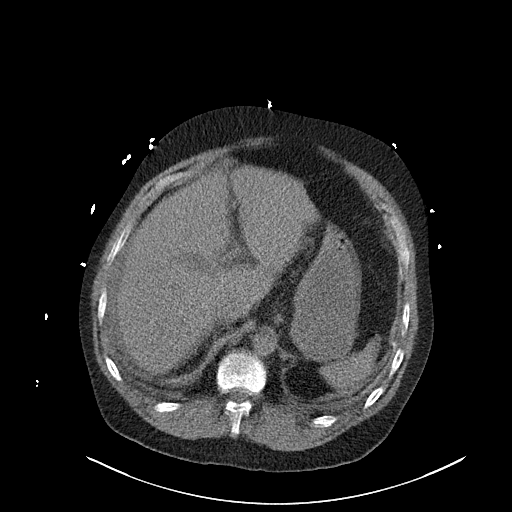
[im 211/326  soft-tissue]
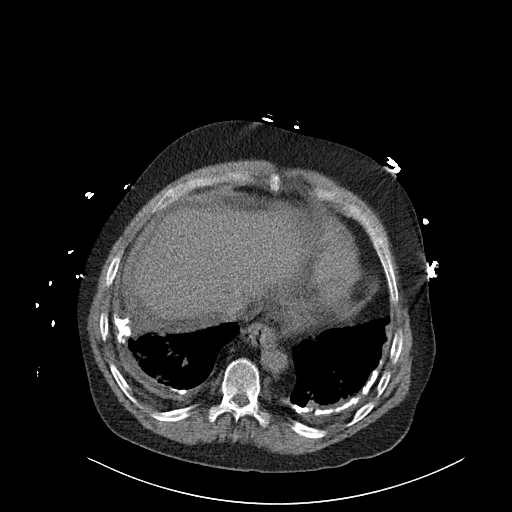
[im 249/326  soft-tissue]
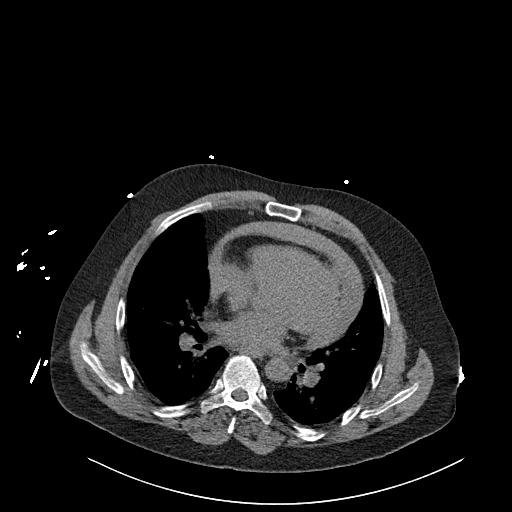
[im 249/326  bone]
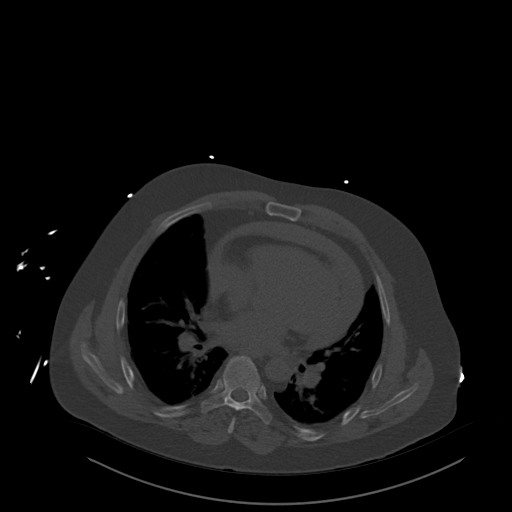
[im 268/326  soft-tissue]
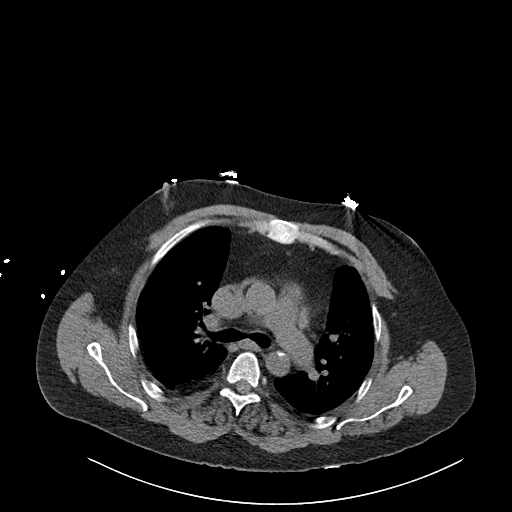
[im 306/326  soft-tissue]
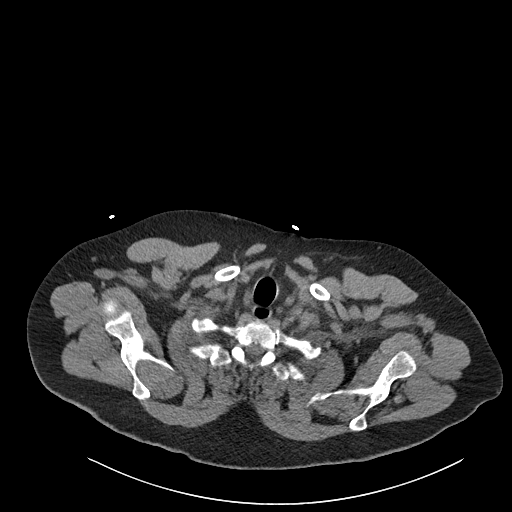

[14 of 46 positions shown; findings below may reference images not displayed]

FINDINGS: CT CHEST FINDINGS

Cardiovascular: Circumferential 1.8 cm thick pericardial effusion is
identified with normal cardiac size. Left main, lad and left
circumflex coronary arteriosclerosis is noted. Minimal aortic
atherosclerosis along the descending portion. The aorta is
nonaneurysmal. The unenhanced pulmonary vasculature is unremarkable.

Mediastinum/Nodes: No enlarged mediastinal, hilar, or axillary lymph
nodes. Thyroid gland, trachea, and esophagus demonstrate no
significant findings.

Lungs/Pleura: Calcified pleural plaque and pleural thickening is
noted bilaterally along the posterior aspect and at the lung bases
in particular. Adjacent atelectasis is noted at each lung base,
right greater than left. Respiratory motion artifact somewhat limits
assessment otherwise. No dominant mass is seen. Pulmonary blebs are
noted at the right lung apex.

Musculoskeletal: No chest wall mass or suspicious bone lesions
identified. Thoracic spondylosis is noted.

CT ABDOMEN PELVIS FINDINGS

Hepatobiliary: Status post cholecystectomy. Trace amount of
perihepatic ascites. Slight nodularity of the liver surface raises
suspicion for morphologic changes of cirrhosis. No definite
space-occupying mass is seen.

Pancreas: Markedly atrophic apart from inflammatory change in the
expected location of the pancreatic head and duodenum.

Spleen: Negative

Adrenals/Urinary Tract: Normal bilateral adrenal glands. Atrophic
native bilateral kidneys without nephrolithiasis nor
hydroureteronephrosis. Two renal transplanted kidneys are seen in
the right lower quadrant without acute appearing abnormality. The
urinary bladder is decompressed in appearance.

Stomach/Bowel: Right upper quadrant mesenteric edema and fluid in
the region of the duodenal bulb and second portion of duodenum
raises possibility of peptic ulcer disease. No definite free air. No
bowel obstruction. Moderate stool retention in the rectum.

Vascular/Lymphatic: Nonaneurysmal atherosclerotic aorta. No
lymphadenopathy by CT size criteria.

Reproductive: Prostate is unremarkable.

Other: No free air.

Musculoskeletal: No acute nor suspicious osseous lesions within the
abdomen pelvis. Lumbar facet arthropathy.
IMPRESSION: 1. Circumferential 1.8 cm pericardial effusion.
2. Left main and 2 vessel coronary arteriosclerosis.
3. Calcified pleural plaque and pleural thickening at the lung bases
with adjacent atelectasis.
4. Right upper quadrant mesenteric edema and fluid in the region of
the duodenal bulb and second portion of duodenum raises suspicion
for peptic ulcer disease. Differential possibility may include acute
pancreatitis the though the pancreas is markedly atrophic and
replaced by fat.
5. Atrophic native kidneys with transplanted kidneys in the right
lower quadrant. No complicating features.
6. Lumbar spondylosis.

These results were called by telephone at the time of interpretation
on 11/24/2018 at [DATE] to PA WLDE GHOULAM , who verbally
acknowledged these results.

Aortic Atherosclerosis ([id number].V).

## 2020-01-16 IMAGING — DX DG CHEST 1V PORT
1 series · 1 of 1 positions shown · non-contrast
Comparison: None.

CLINICAL DATA: Central chest pain for 1 day

EXAM:
PORTABLE CHEST 1 VIEW

[chest]
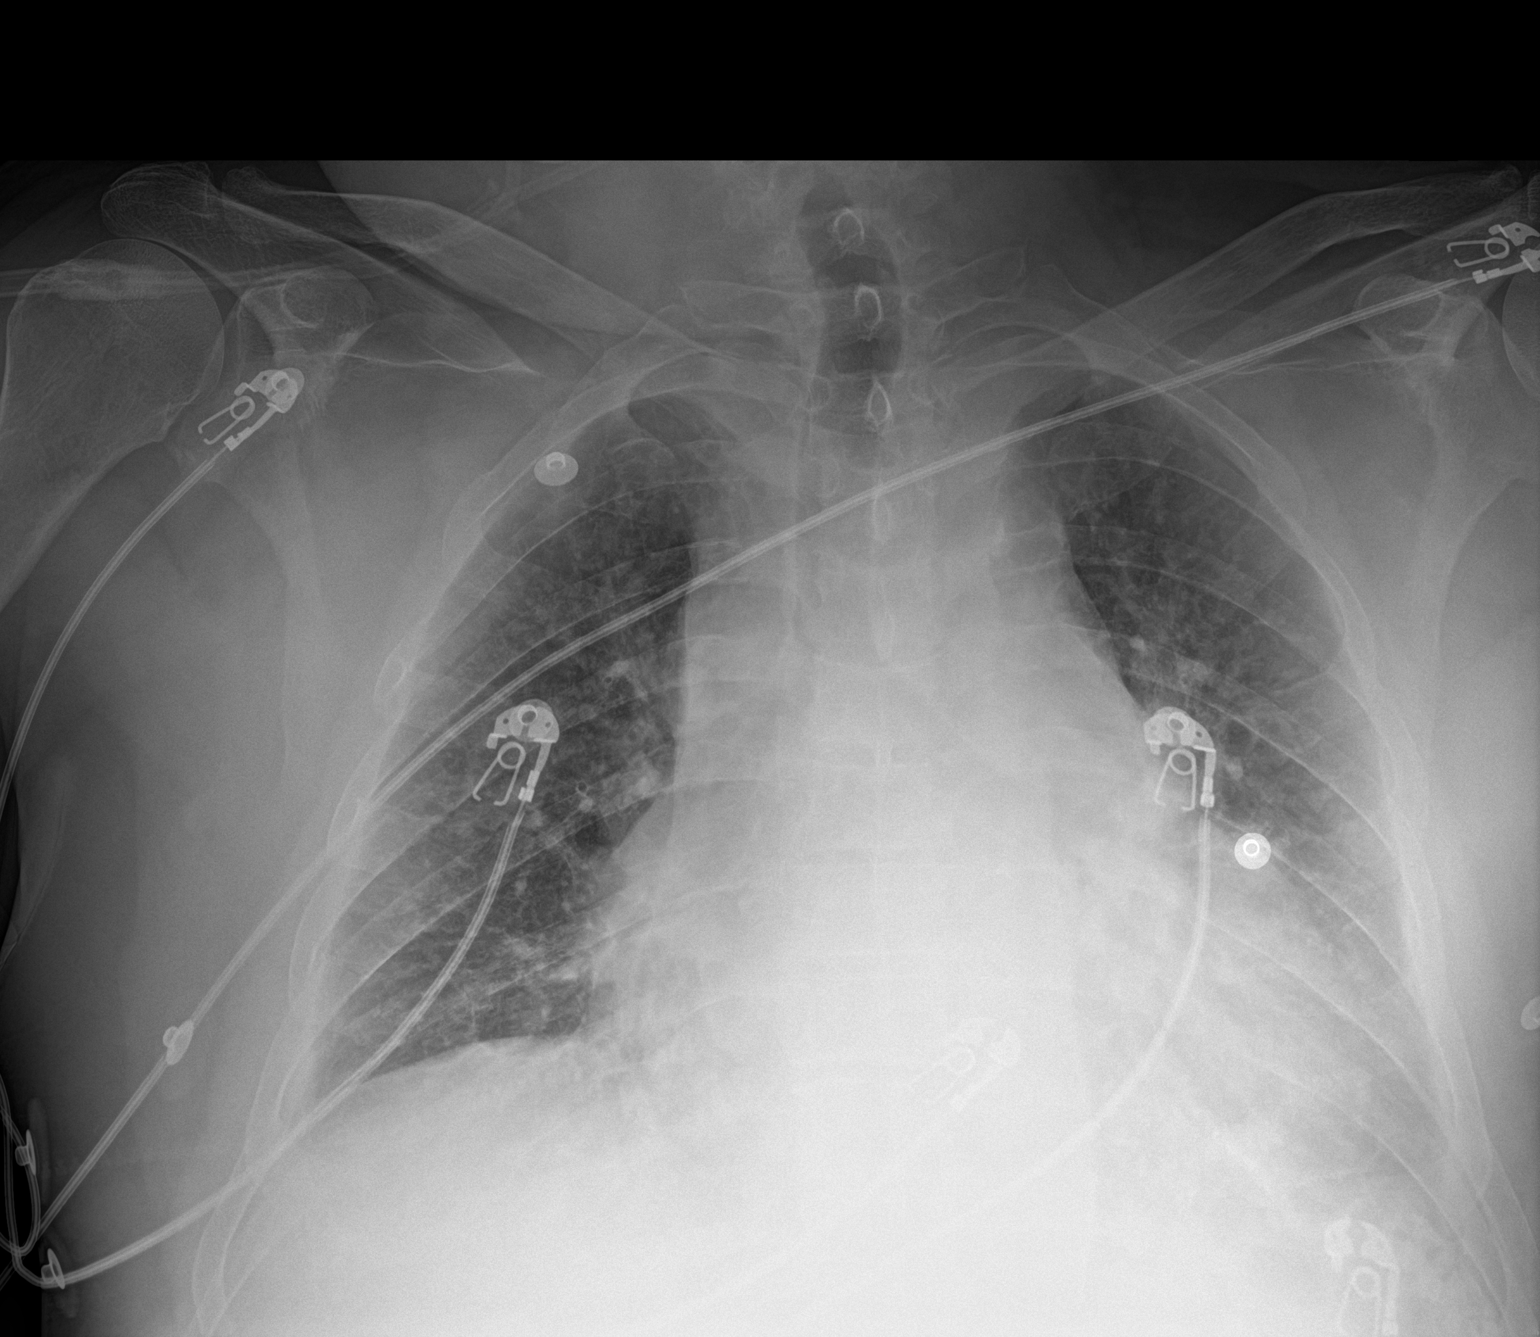

[1 of 1 positions shown; findings below may reference images not displayed]

FINDINGS: Cardiac shadow is enlarged. Mild central vascular congestion is
noted without significant edema. No focal infiltrate is seen. No
bony abnormality is noted.
IMPRESSION: Mild vascular congestion without significant edema.
